# Patient Record
Sex: Male | Born: 1995 | Race: White | Hispanic: No | Marital: Single | Smoking: Never smoker
Health system: Southern US, Community
[De-identification: ages and names within clinical notes are randomized; demographics above are authoritative.]

## PROBLEM LIST (undated history)

## (undated) DIAGNOSIS — S93401A Sprain of unspecified ligament of right ankle, initial encounter: Secondary | ICD-10-CM

## (undated) DIAGNOSIS — L709 Acne, unspecified: Secondary | ICD-10-CM

## (undated) HISTORY — DX: Sprain of unspecified ligament of right ankle, initial encounter: S93.401A

## (undated) HISTORY — DX: Acne, unspecified: L70.9

---
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
## 2015-12-20 ENCOUNTER — Ambulatory Visit (INDEPENDENT_AMBULATORY_CARE_PROVIDER_SITE_OTHER): Payer: BLUE CROSS/BLUE SHIELD | Admitting: Physician Assistant

## 2015-12-20 ENCOUNTER — Ambulatory Visit (INDEPENDENT_AMBULATORY_CARE_PROVIDER_SITE_OTHER): Payer: BLUE CROSS/BLUE SHIELD

## 2015-12-20 VITALS — BP 130/82 | HR 76 | Temp 98.1°F | Resp 16 | Ht 69.0 in | Wt 148.0 lb

## 2015-12-20 DIAGNOSIS — L709 Acne, unspecified: Secondary | ICD-10-CM | POA: Insufficient documentation

## 2015-12-20 DIAGNOSIS — M25571 Pain in right ankle and joints of right foot: Secondary | ICD-10-CM

## 2015-12-20 DIAGNOSIS — S99911A Unspecified injury of right ankle, initial encounter: Secondary | ICD-10-CM | POA: Diagnosis not present

## 2015-12-20 DIAGNOSIS — M7989 Other specified soft tissue disorders: Secondary | ICD-10-CM | POA: Diagnosis not present

## 2015-12-20 DIAGNOSIS — S93401A Sprain of unspecified ligament of right ankle, initial encounter: Secondary | ICD-10-CM | POA: Diagnosis not present
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
## 2015-12-20 NOTE — Patient Instructions (Addendum)
Ankle Sprain An ankle sprain is an injury to the strong, fibrous tissues (ligaments) that hold the bones of your ankle joint together.  CAUSES An ankle sprain is usually caused by a fall or by twisting your ankle. Ankle sprains most commonly occur when you step on the outer edge of your foot, and your ankle turns inward. People who participate in sports are more prone to these types of injuries.  SYMPTOMS   Pain in your ankle. The pain may be present at rest or only when you are trying to stand or walk.  Swelling.  Bruising. Bruising may develop immediately or within 1 to 2 days after your injury.  Difficulty standing or walking, particularly when turning corners or changing directions. DIAGNOSIS  Your caregiver will ask you details about your injury and perform a physical exam of your ankle to determine if you have an ankle sprain. During the physical exam, your caregiver will press on and apply pressure to specific areas of your foot and ankle. Your caregiver will try to move your ankle in certain ways. An X-ray exam may be done to be sure a bone was not broken or a ligament did not separate from one of the bones in your ankle (avulsion fracture).  TREATMENT  Certain types of braces can help stabilize your ankle. Your caregiver can make a recommendation for this. Your caregiver may recommend the use of medicine for pain. If your sprain is severe, your caregiver may refer you to a surgeon who helps to restore function to parts of your skeletal system (orthopedist) or a physical therapist. HOME CARE INSTRUCTIONS   Apply ice to your injury for 1-2 days or as directed by your caregiver. Applying ice helps to reduce inflammation and pain.  Put ice in a plastic bag.  Place a towel between your skin and the bag.  Leave the ice on for 15-20 minutes at a time, every 2 hours while you are awake.  Only take over-the-counter or prescription medicines for pain, discomfort, or fever as directed by  your caregiver.  Elevate your injured ankle above the level of your heart as much as possible for 2-3 days.  If your caregiver recommends crutches, use them as instructed. Gradually put weight on the affected ankle. Continue to use crutches or a cane until you can walk without feeling pain in your ankle.  If you have a plaster splint, wear the splint as directed by your caregiver. Do not rest it on anything harder than a pillow for the first 24 hours. Do not put weight on it. Do not get it wet. You may take it off to take a shower or bath.  You may have been given an elastic bandage to wear around your ankle to provide support. If the elastic bandage is too tight (you have numbness or tingling in your foot or your foot becomes cold and blue), adjust the bandage to make it comfortable.  If you have an air splint, you may blow more air into it or let air out to make it more comfortable. You may take your splint off at night and before taking a shower or bath. Wiggle your toes in the splint several times per day to decrease swelling. SEEK MEDICAL CARE IF:   You have rapidly increasing bruising or swelling.  Your toes feel extremely cold or you lose feeling in your foot.  Your pain is not relieved with medicine. SEEK IMMEDIATE MEDICAL CARE IF:  Your toes are numb or blue.  You have severe pain that is increasing. MAKE SURE YOU:   Understand these instructions.  Will watch your condition.  Will get help right away if you are not doing well or get worse.   This information is not intended to replace advice given to you by your health care provider. Make sure you discuss any questions you have with your health care provider.   Document Released: 09/04/2005 Document Revised: 09/25/2014 Document Reviewed: 09/16/2011 Elsevier Interactive Patient Education 2016 Elsevier Inc.     IF you received an x-ray today, you will receive an invoice from [facility]. Please contact  [facility] at [phone] with questions or concerns regarding your invoice.   IF you received labwork today, you will receive an invoice from [facility]. Please contact [facility] at [phone] with questions or concerns regarding your invoice.   Our billing staff will not be able to assist you with questions regarding bills from these companies.  You will be contacted with the lab results as soon as they are available. The fastest way to get your results is to activate your My Chart account. Instructions are located on the last page of this paperwork. If you have not heard from us regarding the results in 2 weeks, please contact this office.     

## 2016-04-11 ENCOUNTER — Ambulatory Visit (INDEPENDENT_AMBULATORY_CARE_PROVIDER_SITE_OTHER): Payer: BLUE CROSS/BLUE SHIELD | Admitting: Physician Assistant

## 2016-04-11 VITALS — BP 128/76 | HR 87 | Temp 99.0°F | Resp 16 | Ht 69.0 in | Wt 145.4 lb

## 2016-04-11 DIAGNOSIS — S61011A Laceration without foreign body of right thumb without damage to nail, initial encounter: Secondary | ICD-10-CM | POA: Diagnosis not present

## 2016-04-11 DIAGNOSIS — Z026 Encounter for examination for insurance purposes: Secondary | ICD-10-CM | POA: Diagnosis not present

## 2016-04-11 DIAGNOSIS — Z23 Encounter for immunization: Secondary | ICD-10-CM

## 2016-04-11 NOTE — Progress Notes (Signed)
   04/11/2016 1:57 PM   DOB: 08-26-1996 / MRN: [id number]  SUBJECTIVE:  Bryan Jenkins is a 20 y.o. male presenting for an injury that occurred at work yesterday around 1 pm.  Reports he was preparing a sandwhich for a customer and cut the medial aspect of his left thumb.  He was the wound immediately and used peroxide. He covered the wound and went back to working.  He can not remember the last time he had a TD booster.  He denies the los of function of the thumb and feels well overall.     Review of Systems  Skin: Negative for itching and rash.  Neurological: Negative for dizziness, tingling and focal weakness.    Problem list and medications reviewed and updated by myself where necessary, and exist elsewhere in the encounter.   OBJECTIVE:  BP 128/76   Pulse 87   Temp 99 F (37.2 C) (Oral)   Resp 16   Ht 5\' 9"  (1.753 m)   Wt 145 lb 6.4 oz (66 kg)   SpO2 99%   BMI 21.47 kg/m   Physical Exam  Constitutional: Vital signs are normal. He appears well-developed and well-nourished.     Risk and benefits discussed and verbal consent obtained. Anesthetic allergies reviewed. Patient anesthetized using 1:1 mix of 2% lidocaine with epi and Marcaine. The wound was cleansed thoroughly with soap and water. Sterile prep and drape. Wound closed with 4 throws using 4-0 Ethilon suture material. Hemostasis achieved. Mupirocin applied to the wound and bandage placed. The patient tolerated well. Wound instructions were provided and the patient is to return in 9 days for suture removal.    No results found for this or any previous visit (from the past 72 hour(s)).  No results found.  ASSESSMENT AND PLAN  Bryan Jenkins was seen today for laceration.  Diagnoses and all orders for this visit:  Thumb laceration, right, initial encounter: Repaired.  RTC in 9 days for suture removal.  Advised he keep the wound covered particularly while working, otherwise no restrictions.   Encounter related to worker's  compensation claim: See above.   Need for tetanus booster -     Tdap vaccine greater than or equal to 7yo IM    The patient was advised to call or return to clinic if he does not see an improvement in symptoms, or to seek the care of the closest emergency department if he worsens with the above plan.   Deliah Boston, MHS, PA-C [facility] 04/11/2016 1:57 PM

## 2016-04-11 NOTE — Patient Instructions (Addendum)
WOUND CARE Please return in 9 days to have your stitches/staples removed or sooner if you have concerns. Marland Kitchen Keep area clean and dry for 24 hours. Do not remove bandage, if applied. . After 24 hours, remove bandage and wash wound gently with mild soap and warm water. Reapply a new bandage after cleaning wound, if directed. . Continue daily cleansing with soap and water until stitches/staples are removed. . Do not apply any ointments or creams to the wound while stitches/staples are in place, as this may cause delayed healing. . Notify the office if you experience any of the following signs of infection: Swelling, redness, pus drainage, streaking, fever >101.0 F . Notify the office if you experience excessive bleeding that does not stop after 15-20 minutes of constant, firm pressure.      IF you received an x-ray today, you will receive an invoice from [facility]. Please contact [facility] at [phone] with questions or concerns regarding your invoice.   IF you received labwork today, you will receive an invoice from [facility]. Please contact [facility] at [phone] with questions or concerns regarding your invoice.   Our billing staff will not be able to assist you with questions regarding bills from these companies.  You will be contacted with the lab results as soon as they are available. The fastest way to get your results is to activate your My Chart account. Instructions are located on the last page of this paperwork. If you have not heard from [place] regarding the results in 2 weeks, please contact this office.

## 2016-04-20 ENCOUNTER — Encounter: Payer: Self-pay | Admitting: Physician Assistant

## 2016-04-20 ENCOUNTER — Ambulatory Visit (INDEPENDENT_AMBULATORY_CARE_PROVIDER_SITE_OTHER): Payer: BLUE CROSS/BLUE SHIELD | Admitting: Physician Assistant

## 2016-04-20 VITALS — BP 114/62 | HR 68 | Temp 99.1°F | Resp 16

## 2016-04-20 DIAGNOSIS — S61011D Laceration without foreign body of right thumb without damage to nail, subsequent encounter: Secondary | ICD-10-CM

## 2016-04-20 NOTE — Progress Notes (Signed)
   Bryan Jenkins  MRN: [id number] DOB: 08/23/1996  Subjective:  Pt presents to clinic for suture removal.  He has has no problems with the wound -  Review of Systems  Constitutional: Negative for chills and fever.  Gastrointestinal: Negative for nausea.  Skin: Positive for wound.    Patient Active Problem List   Diagnosis Date Noted  . Acne 12/20/2015    Current Outpatient Prescriptions on File Prior to Visit  Medication Sig Dispense Refill  . doxycycline (DORYX) 100 MG EC tablet Take 100 mg by mouth 2 (two) times daily.     No current facility-administered medications on file prior to visit.     No Known Allergies  Pt patients social history was reviewed and updated.  Objective:  BP 114/62 (BP Location: Right Arm, Patient Position: Sitting, Cuff Size: Normal)   Pulse 68   Temp 99.1 F (37.3 C)   Resp 16   Physical Exam  Constitutional: He is oriented to person, place, and time and well-developed, well-nourished, and in no distress.  HENT:  Head: Normocephalic and atraumatic.  Right Ear: External ear normal.  Left Ear: External ear normal.  Eyes: Conjunctivae are normal.  Neck: Normal range of motion.  Pulmonary/Chest: Effort normal.  Neurological: He is alert and oriented to person, place, and time. Gait normal.  Skin: Skin is warm and dry.  Psychiatric: Mood, memory, affect and judgment normal.    Assessment and Plan :  Thumb laceration, right, subsequent encounter  Well healed laceration - sutures removed without problems  Benny Lennert PA-C  [facility] 04/20/2016 1:38 PM

## 2016-04-20 NOTE — Patient Instructions (Signed)
     IF you received an x-ray today, you will receive an invoice from [facility]. Please contact [facility] at [phone] with questions or concerns regarding your invoice.   IF you received labwork today, you will receive an invoice from [facility]. Please contact [facility] at [phone] with questions or concerns regarding your invoice.   Our billing staff will not be able to assist you with questions regarding bills from these companies.  You will be contacted with the lab results as soon as they are available. The fastest way to get your results is to activate your My Chart account. Instructions are located on the last page of this paperwork. If you have not heard from us regarding the results in 2 weeks, please contact this office.      

## 2016-08-16 IMAGING — CR DG ANKLE COMPLETE 3+V*R*
4 series · 4 of 4 positions shown · non-contrast
Comparison: None.

CLINICAL DATA: Right ankle injury

EXAM:
RIGHT ANKLE - COMPLETE 3+ VIEW

[AP]
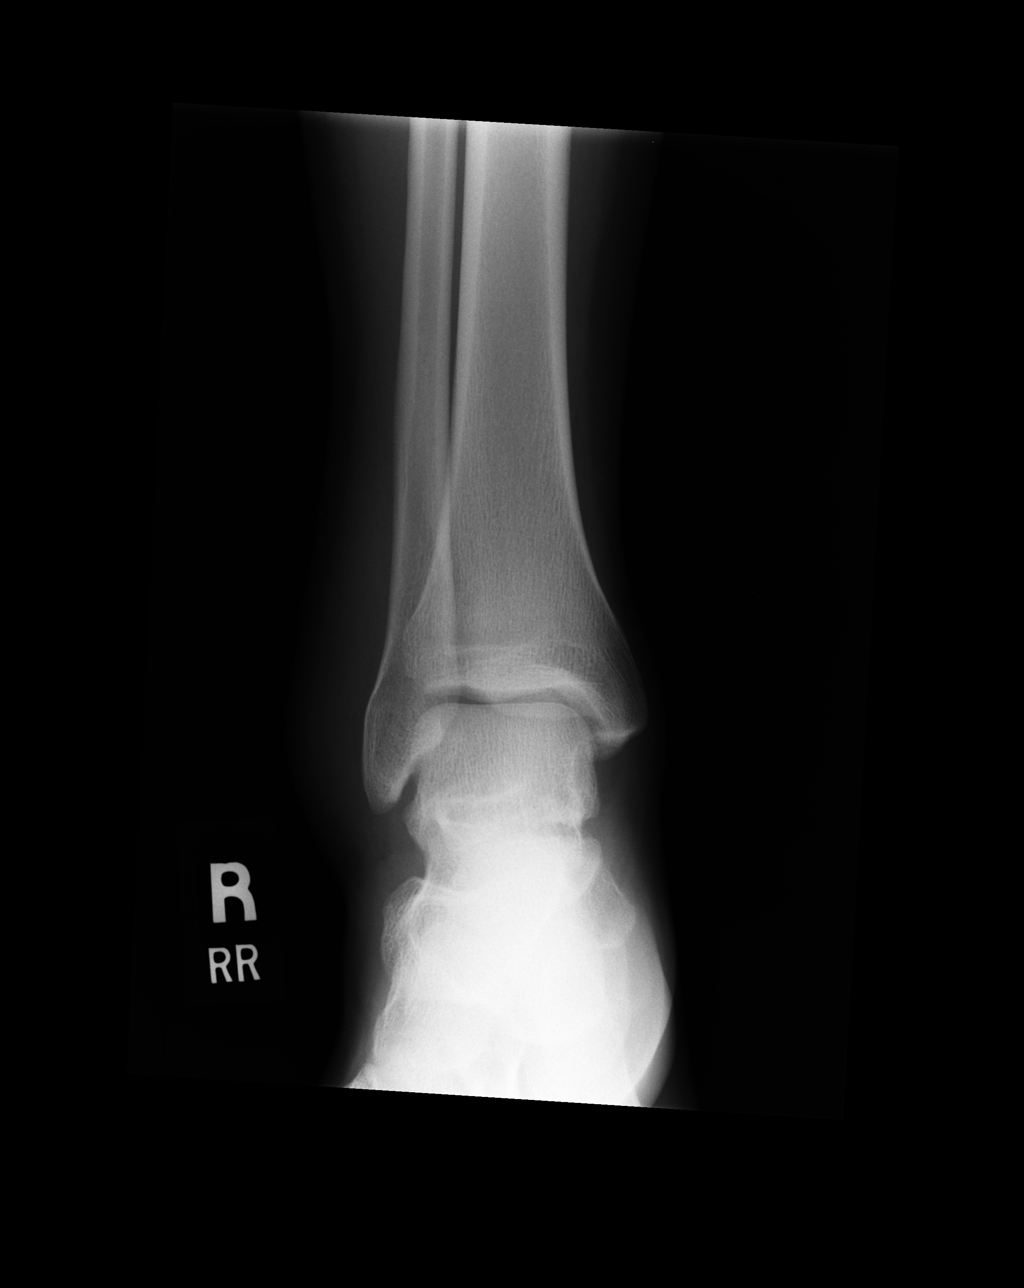

[ap obl int rot (1 of 2)]
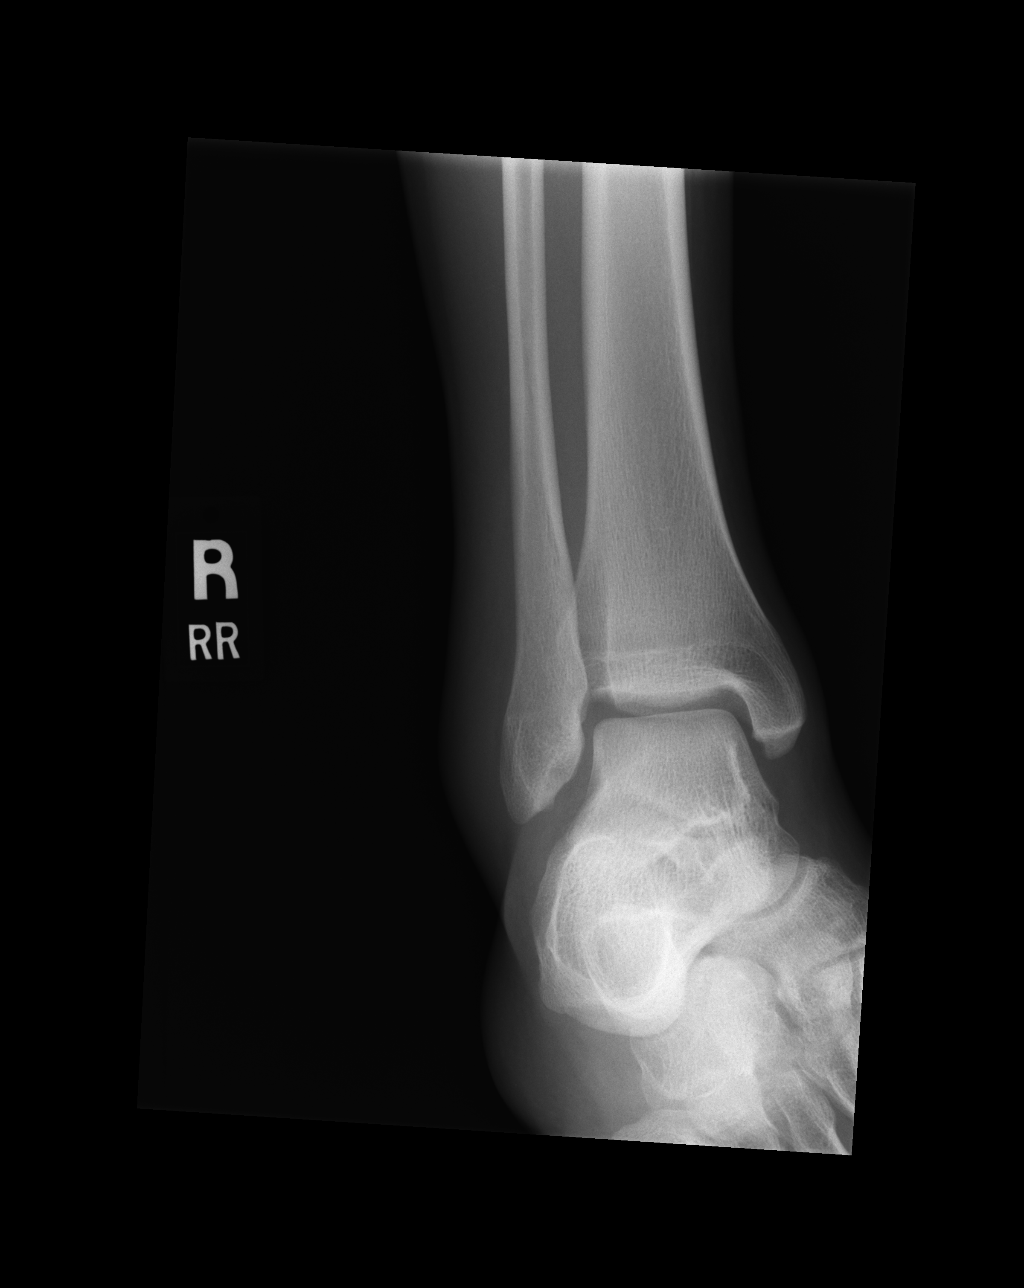

[ap obl int rot (2 of 2)]
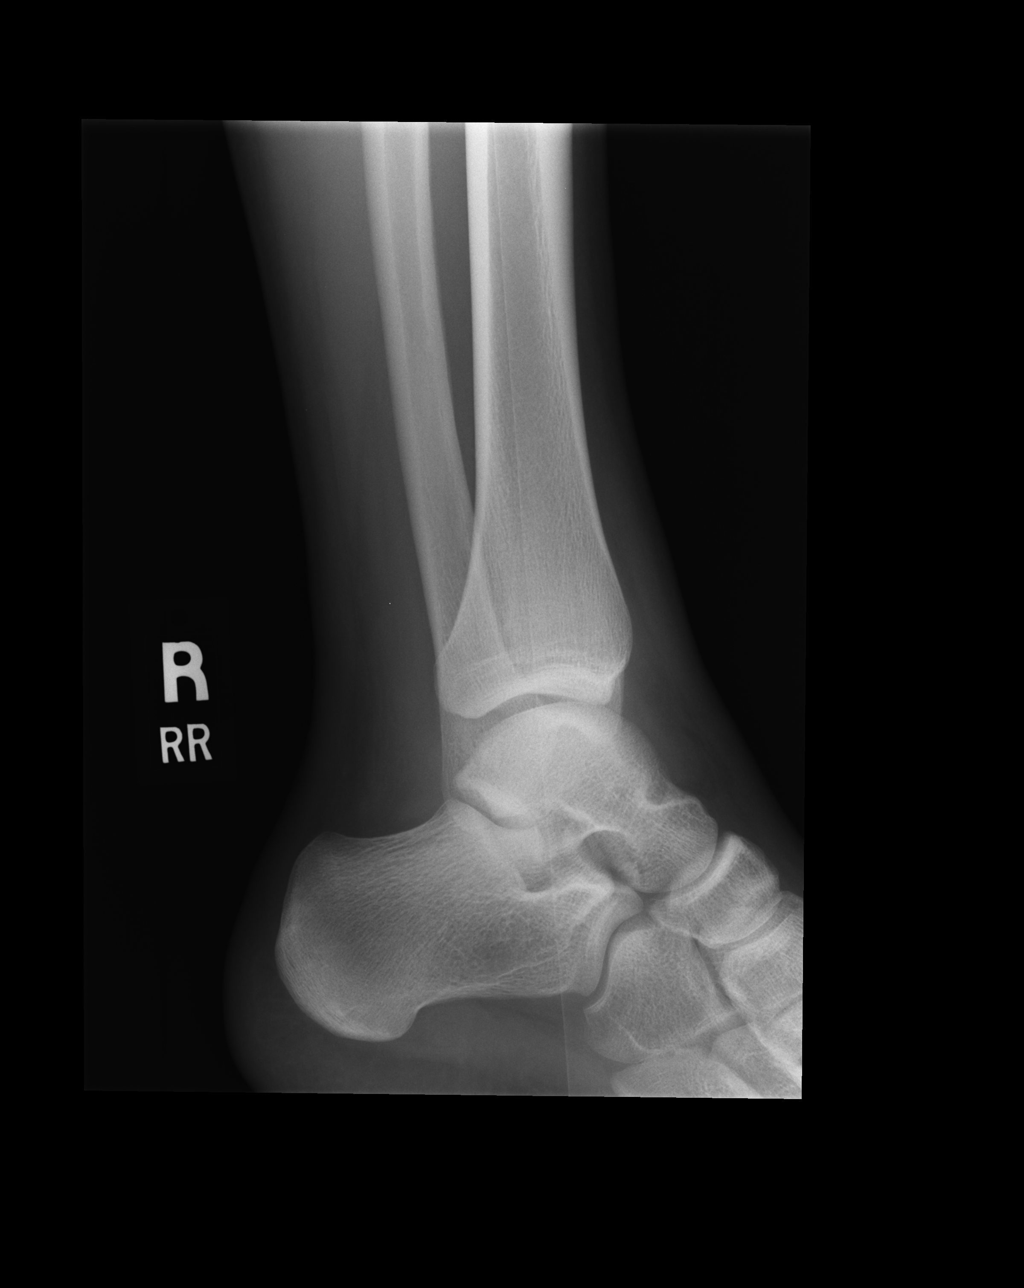

[lateral]
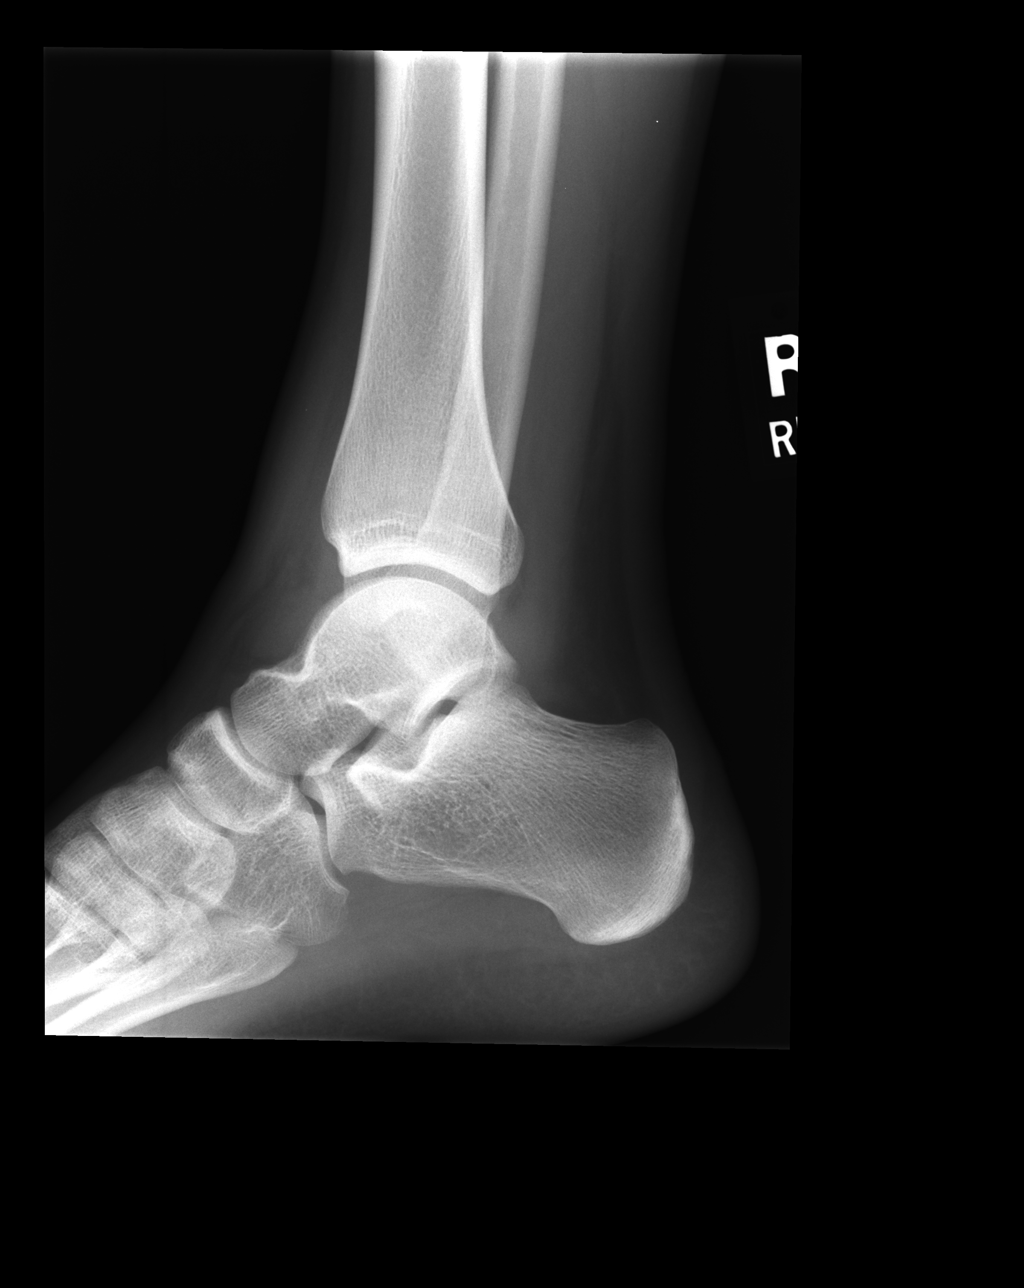

[4 of 4 positions shown; findings below may reference images not displayed]

FINDINGS: Four views of the right ankle submitted. No acute fracture or
subluxation. Significant soft tissue swelling adjacent to lateral
malleolus. Ankle mortise is preserved.
IMPRESSION: No acute fracture or subluxation. Significant soft tissue swelling
adjacent to lateral malleolus.

## 2016-11-06 ENCOUNTER — Ambulatory Visit (INDEPENDENT_AMBULATORY_CARE_PROVIDER_SITE_OTHER): Payer: BLUE CROSS/BLUE SHIELD | Admitting: Urgent Care

## 2016-11-06 VITALS — BP 122/72 | HR 61 | Temp 98.8°F | Resp 17 | Ht 69.5 in | Wt 154.0 lb

## 2016-11-06 DIAGNOSIS — H9191 Unspecified hearing loss, right ear: Secondary | ICD-10-CM | POA: Diagnosis not present

## 2016-11-06 DIAGNOSIS — H6123 Impacted cerumen, bilateral: Secondary | ICD-10-CM

## 2016-11-06 NOTE — Patient Instructions (Addendum)
Earwax Buildup Your ears make a substance called earwax. It may also be called cerumen. Sometimes, too much earwax builds up in your ear canal. This can cause ear pain and make it harder for you to hear. CAUSES This condition is caused by too much earwax production or buildup. RISK FACTORS The following factors may make you more likely to develop this condition:  Cleaning your ears often with swabs.  Having narrow ear canals.  Having earwax that is overly thick or sticky.  Having eczema.  Being dehydrated. SYMPTOMS Symptoms of this condition include:  Reduced hearing.  Ear drainage.  Ear pain.  Ear itch.  A feeling of fullness in the ear or feeling that the ear is plugged.  Ringing in the ear.  Coughing. DIAGNOSIS Your health care provider can diagnose this condition based on your symptoms and medical history. Your health care provider will also do an ear exam to look inside your ear with a scope (otoscope). You may also have a hearing test. TREATMENT Treatment for this condition includes:  Over-the-counter or prescription ear drops to soften the earwax.  Earwax removal by a health care provider. This may be done:  By flushing the ear with body-temperature water.  With a medical instrument that has a loop at the end (earwax curette).  With a suction device. HOME CARE INSTRUCTIONS  Take over-the-counter and prescription medicines only as told by your health care provider.  Do not put any objects, including an ear swab, into your ear. You can clean the opening of your ear canal with a washcloth.  Drink enough water to keep your urine clear or pale yellow.  If you have frequent earwax buildup or you use hearing aids, consider seeing your health care provider every 6-12 months for routine preventive ear cleanings. Keep all follow-up visits as told by your health care provider. SEEK MEDICAL CARE IF:  You have ear pain.  Your condition does not improve with  treatment.  You have hearing loss.  You have blood, pus, or other fluid coming from your ear. This information is not intended to replace advice given to you by your health care provider. Make sure you discuss any questions you have with your health care provider. Document Released: 10/12/2004 Document Revised: 12/27/2015 Document Reviewed: 04/21/2015 Elsevier Interactive Patient Education  2017 Elsevier Inc.      IF you received an x-ray today, you will receive an invoice from [facility]. Please contact [facility] at [phone] with questions or concerns regarding your invoice.   IF you received labwork today, you will receive an invoice from [facility]. Please contact [facility] at [phone] with questions or concerns regarding your invoice.   Our billing staff will not be able to assist you with questions regarding bills from these companies.  You will be contacted with the lab results as soon as they are available. The fastest way to get your results is to activate your My Chart account. Instructions are located on the last page of this paperwork. If you have not heard from us regarding the results in 2 weeks, please contact this office.      

## 2016-11-06 NOTE — Progress Notes (Signed)
    MRN: [id number] DOB: 1996/05/18  Subjective:   Bryan Jenkins is a 21 y.o. male presenting for chief complaint of can't hear well out right ear (not sure why )  Reports 3 day history of decreased hearing in right ear. He tried to use a Q-tip to remove the ear wax but made it worse. He then bought an otc ear wax solution but was painful. Denies fever, ear pain, ear drainage, tinnitus, sinus pain, sore throat, cough.  Bryan Jenkins currently has no medications in their medication list. Also has No Known Allergies.  Bryan Jenkins  has a past medical history of Acne and Right ankle sprain. Also  has no past surgical history on file.  Objective:   Vitals: BP 122/72 (BP Location: Right Arm, Patient Position: Sitting, Cuff Size: Normal)   Pulse 61   Temp 98.8 F (37.1 C) (Oral)   Resp 17   Ht 5' 9.5" (1.765 m)   Wt 154 lb (69.9 kg)   SpO2 97%   BMI 22.42 kg/m   Physical Exam  Constitutional: He is oriented to person, place, and time. He appears well-developed and well-nourished.  HENT:  Right TM is cerumen impacted > Left TM. No tragus tenderness. Nasal turbinates pink and moist, nasal passages patent. No sinus tenderness. Oropharynx clear, mucous membranes moist, dentition in good repair.  Cardiovascular: Normal rate.   Pulmonary/Chest: Effort normal.  Neurological: He is alert and oriented to person, place, and time.   Assessment and Plan :   1. Bilateral impacted cerumen 2. Decreased hearing, right - Ear lavage performed in clinic.  Bryan BambergMario Rayyan Burley, PA-C [facility] [phone] 11/06/2016  5:26 PM

## 2017-01-04 DIAGNOSIS — L700 Acne vulgaris: Secondary | ICD-10-CM | POA: Diagnosis not present

## 2017-12-20 ENCOUNTER — Encounter: Payer: Self-pay | Admitting: Physician Assistant

## 2018-10-24 ENCOUNTER — Encounter (HOSPITAL_COMMUNITY): Payer: Self-pay

## 2018-10-24 ENCOUNTER — Ambulatory Visit (HOSPITAL_COMMUNITY)
Admission: EM | Admit: 2018-10-24 | Discharge: 2018-10-24 | Disposition: A | Discharge status: Home / Self Care | Payer: BLUE CROSS/BLUE SHIELD | Attending: Family Medicine | Admitting: Family Medicine

## 2018-10-24 ENCOUNTER — Other Ambulatory Visit: Payer: Self-pay

## 2018-10-24 DIAGNOSIS — H6122 Impacted cerumen, left ear: Secondary | ICD-10-CM

## 2018-10-24 NOTE — ED Triage Notes (Signed)
Pt cc left ear discomfort. Pt states his ear is clogged up. X 5 days

## 2018-10-30 NOTE — ED Provider Notes (Signed)
  [facility]   [id number] 10/24/18 Arrival Time: 1522  ASSESSMENT & PLAN:  1. Impacted cerumen of left ear    Manual cerumen from L EAC with ear curette performed by me. Improvement in symptoms. No sign of infection.  May f/u with PCP or here as needed.  Reviewed expectations re: course of current medical issues. Questions answered. Outlined signs and symptoms indicating need for more acute intervention. Patient verbalized understanding. After Visit Summary given.   SUBJECTIVE: History from: patient.  Bryan Jenkins is a 23 y.o. male who presents with complaint of left otalgia with slightly decreased hearing; without drainage; without bleeding. Onset gradual, over the past 5 days. Recent cold symptoms: none. Fever: no. Overall normal PO intake without n/v. Sick contacts: no. OTC treatment: none reported.  Social History   Tobacco Use  Smoking Status Never Smoker  Smokeless Tobacco Never Used    ROS: As per HPI.   OBJECTIVE:  Vitals:   10/24/18 1545 10/24/18 1546  BP: 130/83   Pulse: 67   Resp: 18   Temp: 97.9 F (36.6 C)   TempSrc: Oral   SpO2: 98%   Weight:  65.8 kg     General appearance: alert; appears fatigued Ear Canal: cerumen on the left TM: bilateral: normal Neck: supple without LAD Lungs: unlabored respirations, symmetrical air entry; no respiratory distress Skin: warm and dry Psychological: alert and cooperative; normal mood and affect  No Known Allergies  Past Medical History:  Diagnosis Date  . Acne   . Right ankle sprain     Social History   Socioeconomic History  . Marital status: Single    Spouse name: n/a  . Number of children: 0  . Years of education: 12th grade  . Highest education level: Not on file  Occupational History  . Occupation: Museum/gallery exhibitions officer    Comment: part time  Social Needs  . Financial resource strain: Not on file  . Food insecurity:    Worry: Not on file    Inability: Not on file  .  Transportation needs:    Medical: Not on file    Non-medical: Not on file  Tobacco Use  . Smoking status: Never Smoker  . Smokeless tobacco: Never Used  Substance and Sexual Activity  . Alcohol use: No    Alcohol/week: 0.0 standard drinks  . Drug use: No  . Sexual activity: Not on file  Lifestyle  . Physical activity:    Days per week: Not on file    Minutes per session: Not on file  . Stress: Not on file  Relationships  . Social connections:    Talks on phone: Not on file    Gets together: Not on file    Attends religious service: Not on file    Active member of club or organization: Not on file    Attends meetings of clubs or organizations: Not on file    Relationship status: Not on file  . Intimate partner violence:    Fear of current or ex partner: Not on file    Emotionally abused: Not on file    Physically abused: Not on file    Forced sexual activity: Not on file  Other Topics Concern  . Not on file  Social History Narrative   Lives with his parents alternately (they are divorced).            Mardella Layman, MD 10/30/18 1108

## 2019-06-21 ENCOUNTER — Encounter (HOSPITAL_COMMUNITY): Payer: Self-pay | Admitting: *Deleted

## 2019-06-21 ENCOUNTER — Ambulatory Visit (HOSPITAL_COMMUNITY)
Admission: EM | Admit: 2019-06-21 | Discharge: 2019-06-21 | Disposition: A | Discharge status: Home / Self Care | Payer: BC Managed Care – PPO | Attending: Family Medicine | Admitting: Family Medicine

## 2019-06-21 ENCOUNTER — Other Ambulatory Visit: Payer: Self-pay

## 2019-06-21 DIAGNOSIS — R519 Headache, unspecified: Secondary | ICD-10-CM | POA: Insufficient documentation

## 2019-06-21 DIAGNOSIS — Z20828 Contact with and (suspected) exposure to other viral communicable diseases: Secondary | ICD-10-CM | POA: Diagnosis not present

## 2019-06-21 DIAGNOSIS — J029 Acute pharyngitis, unspecified: Secondary | ICD-10-CM | POA: Insufficient documentation

## 2019-06-21 LAB — POCT RAPID STREP A: Streptococcus, Group A Screen (Direct): NEGATIVE

## 2019-06-21 NOTE — Discharge Instructions (Signed)
OK to take Tylenol 1000 mg (2 extra strength tabs) or 975 mg (3 regular strength tabs) every 6 hours as needed.  Ibuprofen 400-600 mg (2-3 over the counter strength tabs) every 6 hours as needed for pain.  Continue supportive care.  OK to use Debrox (peroxide) in the ear to loosen up wax. Also recommend using a bulb syringe (for removing boogers from baby's noses) to flush through warm water. Do not use Q-tips as this can impact wax further.

## 2019-06-21 NOTE — ED Provider Notes (Signed)
  [place]    CSN: [id number] Arrival date & time: 06/21/19  1007  Chief Complaint  Patient presents with  . Sore Throat  . Headache    Bryan Jenkins here for URI complaints.  Duration: 1 day  Associated symptoms: sore throat Denies: sinus congestion, sinus pain, rhinorrhea, itchy watery eyes, ear pain, ear drainage, wheezing, shortness of breath, myalgia, GI s/s's, and fevers Treatment to date: Elderberry, salt water gargles, throat spray Sick contacts: No  ROS:  Const: Denies fevers HEENT: As noted in HPI Lungs: No SOB  Past Medical History:  Diagnosis Date  . Acne   . Right ankle sprain     BP 113/68   Pulse 61   Temp 98 F (36.7 C) (Other (Comment))   Resp 16   SpO2 100%  General: Awake, alert, appears stated age HEENT: AT, [place], ears patent b/l and TM's neg, nares patent w/o discharge, pharynx mildly erythematous and without exudates, MMM Neck: No masses or asymmetry Heart: RRR Lungs: CTAB, no accessory muscle use Psych: Age appropriate judgment and insight, normal mood and affect  Sore throat  Ibuprofen, cont supp care. Rapid strep neg.  Continue to push fluids, practice good hand hygiene. F/u prn. If starting to experience fevers, shaking, or shortness of breath, seek immediate care. Pt voiced understanding and agreement to the plan.    Shelda Pal, DO 06/21/19 1052

## 2019-06-21 NOTE — ED Triage Notes (Signed)
C/O sore throat and HA since yesterday without fever, chills.

## 2019-06-22 LAB — NOVEL CORONAVIRUS, NAA (HOSP ORDER, SEND-OUT TO REF LAB; TAT 18-24 HRS): SARS-CoV-2, NAA: NOT DETECTED

## 2020-01-15 ENCOUNTER — Encounter (HOSPITAL_COMMUNITY): Payer: Self-pay

## 2020-01-15 ENCOUNTER — Ambulatory Visit (HOSPITAL_COMMUNITY)
Admission: EM | Admit: 2020-01-15 | Discharge: 2020-01-15 | Disposition: A | Discharge status: Home / Self Care | Payer: BC Managed Care – PPO | Attending: Family Medicine | Admitting: Family Medicine

## 2020-01-15 DIAGNOSIS — L255 Unspecified contact dermatitis due to plants, except food: Secondary | ICD-10-CM

## 2020-01-15 MED ORDER — DEXAMETHASONE SODIUM PHOSPHATE 10 MG/ML IJ SOLN
INTRAMUSCULAR | Status: AC
  Filled 2020-01-15: qty 1

## 2020-01-15 MED ORDER — DEXAMETHASONE SODIUM PHOSPHATE 10 MG/ML IJ SOLN
10.0000 mg | Freq: Once | INTRAMUSCULAR | Status: AC
  Administered 2020-01-15: 10:00:00 10 mg via INTRAMUSCULAR

## 2020-01-15 NOTE — ED Triage Notes (Signed)
Pt reports having a rash in his legs, after he was sin contact with poison ivy 9 days ago. Pt reports he is taking Benadryl without relief.

## 2020-01-15 NOTE — ED Provider Notes (Signed)
[place]   [id number] 01/15/20 Arrival Time: 3016  ASSESSMENT & PLAN:  1. Rhus dermatitis     Meds ordered this encounter  Medications  . dexamethasone (DECADRON) injection 10 mg    Bendryl if needed. No signs of skin infection.  Will follow up with PCP or here if worsening or failing to improve as anticipated. Reviewed expectations re: course of current medical issues. Questions answered. Outlined signs and symptoms indicating need for more acute intervention. Patient verbalized understanding. After Visit Summary given.   SUBJECTIVE:  Bryan Jenkins is a 24 y.o. male who presents with a skin complaint.   Location: bilateral LE Onset: abrupt Duration: 1 week Associated pruritis? significant Associated pain? none Progression: stable  Drainage? none Known trigger? questions rhus New soaps/lotions/topicals/detergents? no Contacts with similar? No  Recent travel? No  Other associated symptoms: none Therapies tried thus far: Benadryl with little relief Arthralgia or myalgia? none Recent illness? none Fever? none New medications? none No specific aggravating or alleviating factors reported.   OBJECTIVE: Vitals:   01/15/20 0939  BP: 120/72  Pulse: 61  Resp: 16  Temp: 98.4 F (36.9 C)  TempSrc: Oral  SpO2: 98%    General appearance: alert; no distress HEENT: [place]; AT Neck: supple with FROM Lungs: clear to auscultation bilaterally Heart: regular rate and rhythm Extremities: no edema; moves all extremities normally Skin: warm and dry; signs of infection: no; {ANATOMY; areas of linear papules and vesicles with surrounding erythema over bilateral LE Psychological: alert and cooperative; normal mood and affect  No Known Allergies  Past Medical History:  Diagnosis Date  . Acne   . Right ankle sprain    Social History   Socioeconomic History  . Marital status: Single    Spouse name: n/a  . Number of children: 0  . Years of education: 12th  grade  . Highest education level: Not on file  Occupational History  . Occupation: [employer]    Comment: part time  Tobacco Use  . Smoking status: Never Smoker  . Smokeless tobacco: Never Used  Substance and Sexual Activity  . Alcohol use: Yes    Comment: occasionally  . Drug use: Not Currently    Types: Cocaine    Comment: last had 2019  . Sexual activity: Not on file  Other Topics Concern  . Not on file  Social History Narrative   Lives with his parents alternately (they are divorced).   Social Determinants of Health   Financial Resource Strain:   . Difficulty of Paying Living Expenses:   Food Insecurity:   . Worried About Charity fundraiser in the Last Year:   . Arboriculturist in the Last Year:   Transportation Needs:   . Film/video editor (Medical):   Marland Kitchen Lack of Transportation (Non-Medical):   Physical Activity:   . Days of Exercise per Week:   . Minutes of Exercise per Session:   Stress:   . Feeling of Stress :   Social Connections:   . Frequency of Communication with Friends and Family:   . Frequency of Social Gatherings with Friends and Family:   . Attends Religious Services:   . Active Member of Clubs or Organizations:   . Attends Archivist Meetings:   Marland Kitchen Marital Status:   Intimate Partner Violence:   . Fear of Current or Ex-Partner:   . Emotionally Abused:   Marland Kitchen Physically Abused:   . Sexually Abused:    Family  History  Problem Relation Age of Onset  . Healthy Mother   . Healthy Father    History reviewed. No pertinent surgical history.   Mardella Layman, MD 01/15/20 1045

## 2020-09-16 ENCOUNTER — Other Ambulatory Visit: Payer: BC Managed Care – PPO

## 2020-09-16 DIAGNOSIS — Z20822 Contact with and (suspected) exposure to covid-19: Secondary | ICD-10-CM

## 2020-09-19 LAB — SARS-COV-2, NAA 2 DAY TAT

## 2020-09-19 LAB — NOVEL CORONAVIRUS, NAA: SARS-CoV-2, NAA: DETECTED — AB

## 2020-09-22 ENCOUNTER — Other Ambulatory Visit: Payer: BC Managed Care – PPO

## 2024-06-30 ENCOUNTER — Ambulatory Visit (HOSPITAL_COMMUNITY): Admission: EM | Admit: 2024-06-30 | Discharge: 2024-06-30 | Disposition: A | Discharge status: Home / Self Care

## 2024-06-30 ENCOUNTER — Encounter (HOSPITAL_COMMUNITY): Payer: Self-pay | Admitting: *Deleted

## 2024-06-30 DIAGNOSIS — B353 Tinea pedis: Secondary | ICD-10-CM | POA: Diagnosis not present

## 2024-06-30 DIAGNOSIS — L03032 Cellulitis of left toe: Secondary | ICD-10-CM

## 2024-06-30 MED ORDER — MUPIROCIN CALCIUM 2 % EX CREA: 1.0000 | TOPICAL_CREAM | Freq: Two times a day (BID) | CUTANEOUS | 0 refills | Status: AC

## 2024-06-30 MED ORDER — AMOXICILLIN-POT CLAVULANATE 875-125 MG PO TABS: 1.0000 | ORAL_TABLET | Freq: Two times a day (BID) | ORAL | 0 refills | Status: AC

## 2024-06-30 MED ORDER — NYSTATIN 100000 UNIT/GM EX CREA: TOPICAL_CREAM | CUTANEOUS | 0 refills | Status: AC

## 2024-06-30 NOTE — ED Provider Notes (Addendum)
 [facility]    CSN: [id number] Arrival date & time: 06/30/24  9141      History   Chief Complaint Chief Complaint  Patient presents with   Foot Pain    HPI Bryan Jenkins is a 28 y.o. male.  Patient works as a Psychologist, occupational.  Patient says that he noticed some pain and redness between his 4th and 5th toes on his left foot roughly 1 week ago.  Often gets athlete's foot and so he has been applying some Lamisil ointment to his entire foot but it is not improving his symptoms.  Over the last 2 days he has noticed some redness spreading from his pinky toe up into the dorsum of his foot.  Denies any fevers or chills at this time.  Denies any numbness or tingling.  Denies any injury to his foot or high risk exposures.  No MRSA risk factors on HPI.  States this feels different than his usual athlete's foot.   Foot Pain    Past Medical History:  Diagnosis Date   Acne    Right ankle sprain     Patient Active Problem List   Diagnosis Date Noted   Acne 12/20/2015    History reviewed. No pertinent surgical history.     Home Medications    Prior to Admission medications   Medication Sig Start Date End Date Taking? Authorizing Provider  amoxicillin-clavulanate (AUGMENTIN) 875-125 MG tablet Take 1 tablet by mouth every 12 (twelve) hours for 10 days. 06/30/24 07/10/24 Yes Lynwood Barter, DO  mupirocin cream (BACTROBAN) 2 % Apply 1 Application topically 2 (two) times daily. 06/30/24  Yes Lynwood Barter, DO  nystatin cream (MYCOSTATIN) Apply to affected area 2 times daily 06/30/24  Yes Lynwood Barter, DO  diphenhydrAMINE (BENADRYL) 50 MG tablet Take 50 mg by mouth at bedtime as needed for itching.    [provider]    Family History Family History  Problem Relation Age of Onset   Healthy Mother    Healthy Father     Social History Social History   Tobacco Use   Smoking status: Never   Smokeless tobacco: Never  Vaping Use   Vaping status: Some Days  Substance  Use Topics   Alcohol use: Yes    Comment: occasionally   Drug use: Not Currently    Types: Cocaine    Comment: last had 2019     Allergies   Patient has no known allergies.   Review of Systems Review of Systems  ROS negative except as noted in HPI above   Physical Exam Triage Vital Signs ED Triage Vitals  Encounter Vitals Group     BP 06/30/24 0954 (!) 128/90     Girls Systolic BP Percentile --      Girls Diastolic BP Percentile --      Boys Systolic BP Percentile --      Boys Diastolic BP Percentile --      Pulse Rate 06/30/24 0954 65     Resp 06/30/24 0954 16     Temp 06/30/24 0954 97.9 F (36.6 C)     Temp Source 06/30/24 0954 Oral     SpO2 06/30/24 0954 98 %     Weight --      Height --      Head Circumference --      Peak Flow --      Pain Score 06/30/24 0953 6     Pain Loc --      Pain  Education --      Exclude from [employer] Chart --    No data found.  Updated Vital Signs BP (!) 128/90 (BP Location: Left Arm)   Pulse 65   Temp 97.9 F (36.6 C) (Oral)   Resp 16   SpO2 98%   Visual Acuity Right Eye Distance:   Left Eye Distance:   Bilateral Distance:    Right Eye Near:   Left Eye Near:    Bilateral Near:     Physical Exam Vitals and nursing note reviewed.  Constitutional:      General: He is not in acute distress.    Appearance: He is well-developed. He is not ill-appearing, toxic-appearing or diaphoretic.  HENT:     Head: Normocephalic and atraumatic.  Eyes:     Conjunctiva/sclera: Conjunctivae normal.  Cardiovascular:     Rate and Rhythm: Normal rate and regular rhythm.     Heart sounds: No murmur heard. Pulmonary:     Effort: Pulmonary effort is normal. No respiratory distress.     Breath sounds: Normal breath sounds.  Abdominal:     Palpations: Abdomen is soft.     Tenderness: There is no abdominal tenderness.  Musculoskeletal:        General: No swelling.     Cervical back: Neck supple.  Skin:    General: Skin is warm and dry.      Capillary Refill: Capillary refill takes less than 2 seconds.     Findings: Erythema (proximally spreading from 5th toe up onto dorsum of foot) and lesion (in between 4th and 5th toes on Left foot) present.  Neurological:     General: No focal deficit present.     Mental Status: He is alert.  Psychiatric:        Mood and Affect: Mood normal.      UC Treatments / Results  Labs (all labs ordered are listed, but only abnormal results are displayed) Labs Reviewed - No data to display  EKG   Radiology No results found.  Procedures Procedures (including critical care time)  Medications Ordered in UC Medications - No data to display  Initial Impression / Assessment and Plan / UC Course  I have reviewed the triage vital signs and the nursing notes.  Pertinent labs & imaging results that were available during my care of the patient were reviewed by me and considered in my medical decision making (see chart for details).     HPI and exam consistent with left foot cellulitis Final Clinical Impressions(s) / UC Diagnoses   #Cellulitis of left fifth toe spreading proximally to dorsum of foot Please take Augmentin prescription twice daily for the next 10 days to treat left foot infection Please apply mupirocin ointment in between 4th and 5th toes twice daily for next 10 days Please apply nystatin ointment to entire foot to help treat athlete's foot as needed Please return to clinic if redness on foot continues to spread up leg or you develop new onset fevers or chills Patient understands and agrees to treatment plan.  No further questions or concerns at this time.  Final diagnoses:  Cellulitis of small toe of left foot  Athlete's foot on left     Discharge Instructions      Please take Augmentin prescription twice daily for the next 10 days to treat left foot infection Please apply mupirocin ointment in between 4th and 5th toes twice daily for next 10 days Please apply  nystatin ointment to entire foot to  help treat athlete's foot as needed Please return to clinic if redness on foot continues to spread up leg or you develop new onset fevers or chills     ED Prescriptions     Medication Sig Dispense Auth. Provider   amoxicillin-clavulanate (AUGMENTIN) 875-125 MG tablet Take 1 tablet by mouth every 12 (twelve) hours for 10 days. 20 tablet Lynwood Barter, DO   mupirocin cream (BACTROBAN) 2 % Apply 1 Application topically 2 (two) times daily. 30 g Lynwood Barter, DO   nystatin cream (MYCOSTATIN) Apply to affected area 2 times daily 30 g Lynwood Barter, DO      PDMP not reviewed this encounter.   Lynwood Barter, DO 06/30/24 1023    Lynwood Barter, DO 06/30/24 1023

## 2024-06-30 NOTE — Discharge Instructions (Addendum)
 Please take Augmentin prescription twice daily for the next 10 days to treat left foot infection Please apply mupirocin ointment in between 4th and 5th toes twice daily for next 10 days Please apply nystatin ointment to entire foot to help treat athlete's foot as needed Please return to clinic if redness on foot continues to spread up leg or you develop new onset fevers or chills

## 2024-06-30 NOTE — ED Triage Notes (Signed)
 Pt states he has left foot pain X 6 days. He has been putting lotrimin on it without relief. There is some redness and swelling

## 2024-07-03 ENCOUNTER — Ambulatory Visit

## 2024-07-03 DIAGNOSIS — L03116 Cellulitis of left lower limb: Secondary | ICD-10-CM | POA: Diagnosis not present

## 2024-07-03 DIAGNOSIS — L03032 Cellulitis of left toe: Secondary | ICD-10-CM | POA: Diagnosis not present

## 2024-07-03 NOTE — Progress Notes (Signed)
 Subjective:  Patient ID: Bryan Jenkins, male    DOB: [birth year],  MRN: [id number]  Chief Complaint  Patient presents with   Cellulitis    Patient states that he went to urgent care and they gave him some anabiotics for his Cellulitis, patient has some pain when stepping down on his foot    28 y.o. male presents with the above complaint. He relates to a history of athlete's foot that he normally treats successfully with lotrimin. He has to wear steel toed boots at work and experiences sweating of his feet. He states that recently his athlete's foot got progressively worse and developed a small crack in the left 4th interspace. He then developed cellulitis surrounding the area.  He proceeded to go to urgent care where they started him on Augmentin.  He then experienced a worsening of his infection over the next 12 to 24 hours before the area settle down and cellulitis significantly improved.  He states it has continued to improve. He does have remaining Augmentin.  Review of Systems: Negative except as noted in the HPI. Denies N/V/F/Ch.  Past Medical History:  Diagnosis Date   Acne    Right ankle sprain     Current Outpatient Medications:    amoxicillin-clavulanate (AUGMENTIN) 875-125 MG tablet, Take 1 tablet by mouth every 12 (twelve) hours for 10 days., Disp: 20 tablet, Rfl: 0   diphenhydrAMINE (BENADRYL) 50 MG tablet, Take 50 mg by mouth at bedtime as needed for itching., Disp: , Rfl:    mupirocin cream (BACTROBAN) 2 %, Apply 1 Application topically 2 (two) times daily., Disp: 30 g, Rfl: 0   nystatin cream (MYCOSTATIN), Apply to affected area 2 times daily, Disp: 30 g, Rfl: 0  Social History   Tobacco Use  Smoking Status Never  Smokeless Tobacco Never    No Known Allergies Objective:  There were no vitals filed for this visit. There is no height or weight on file to calculate BMI. Constitutional Well developed. Well nourished. Oriented to person, place, and time.  Vascular  Dorsalis pedis pulses palpable bilaterally. Posterior tibial pulses palpable bilaterally. Capillary refill normal to all digits.  No cyanosis or clubbing noted. Pedal hair growth normal.  Neurologic Normal speech. Epicritic sensation to light touch grossly present bilaterally. Negative tinel sign at tarsal tunnel bilaterally.   Dermatologic Skin texture and turgor are within normal limits.  Pinpoint, superficial opening in the fourth interspace that does not probe deep. Mild maceration in interspace. There is minimal serosanguineous drainage.  No signs of purulence.  There is minimal dorsal foot cellulitis extending from this area.  There is an outline of previous amount of cellulitis, it is approximately 80% resolved. No skin lesions.  Musculoskeletal: 5 out of 5 muscle strength all major pedal muscle groups.  No contributing deformity.      Assessment:   1. Cellulitis of fifth toe of left foot   2. Cellulitis of left foot    Plan:  - Patient was evaluated and treated and all questions answered.  Left foot cellulitis - Discussed with the patient the diagnosis of left foot cellulitis.  He did go to urgent care and an emergency department for evaluations.  He was started on Augmentin.  He does have remaining Augmentin.  His cellulitis has improved significantly.  I recommend that he continues taking the Augmentin. - Discussed the small opening in his left fourth interspace.  There is mild surrounding macerated tissue.  I discussed with him cleaning the area after  working and applying a small amount of Betadine to help dry the area out.  He expresses understanding of this and will plan to do this daily. - RTC if cellulitis does not resolve     Prentice Ovens, DPM AACFAS Fellowship Trained Podiatric Surgeon [facility]

## 2024-07-18 ENCOUNTER — Ambulatory Visit (HOSPITAL_COMMUNITY)
Admission: EM | Admit: 2024-07-18 | Discharge: 2024-07-18 | Disposition: A | Discharge status: Home / Self Care | Attending: Emergency Medicine | Admitting: Emergency Medicine

## 2024-07-18 ENCOUNTER — Encounter (HOSPITAL_COMMUNITY): Payer: Self-pay

## 2024-07-18 DIAGNOSIS — Z202 Contact with and (suspected) exposure to infections with a predominantly sexual mode of transmission: Secondary | ICD-10-CM | POA: Insufficient documentation

## 2024-07-18 DIAGNOSIS — Z113 Encounter for screening for infections with a predominantly sexual mode of transmission: Secondary | ICD-10-CM | POA: Diagnosis present

## 2024-07-18 LAB — HIV ANTIBODY (ROUTINE TESTING W REFLEX): HIV Screen 4th Generation wRfx: NONREACTIVE

## 2024-07-18 MED ORDER — CEFTRIAXONE SODIUM 500 MG IJ SOLR
INTRAMUSCULAR | Status: AC
  Filled 2024-07-18: qty 500

## 2024-07-18 MED ORDER — CEFTRIAXONE SODIUM 500 MG IJ SOLR
500.0000 mg | INTRAMUSCULAR | Status: DC
  Administered 2024-07-18: 500 mg via INTRAMUSCULAR

## 2024-07-18 MED ORDER — LIDOCAINE HCL (PF) 1 % IJ SOLN
INTRAMUSCULAR | Status: AC
  Filled 2024-07-18: qty 2

## 2024-07-18 MED ORDER — DOXYCYCLINE HYCLATE 100 MG PO TABS: 100.0000 mg | ORAL_TABLET | Freq: Two times a day (BID) | ORAL | 0 refills | Status: AC

## 2024-07-18 NOTE — ED Triage Notes (Signed)
 Pt states his gf was positive for chlamydia. States has had penile d/c and burning on urination at times.

## 2024-07-18 NOTE — ED Provider Notes (Signed)
 [facility]    CSN: [id number] Arrival date & time: 07/18/24  1339    HISTORY   Chief Complaint  Patient presents with   Exposure to STD   HPI Bryan Jenkins is a pleasant, 28 y.o. male who presents to urgent care today. Patient requests STD screening. Patient endorses penile discharge, burning during urination, and confirmed exposure to chlamydia.   Patient denies testicular pain or swelling, scrotal pain or swelling, perineal pain, rectal pain, pain with defecation, increased frequency of urination, fever , body aches, chills, rigors, malaise, and significant fatigue.   The history is provided by the patient.  Exposure to STD    Past Medical History:  Diagnosis Date   Acne    Right ankle sprain    Patient Active Problem List   Diagnosis Date Noted   Acne 12/20/2015   History reviewed. No pertinent surgical history.  Home Medications    Prior to Admission medications   Medication Sig Start Date End Date Taking? Authorizing Provider  diphenhydrAMINE (BENADRYL) 50 MG tablet Take 50 mg by mouth at bedtime as needed for itching.    [provider]  mupirocin cream (BACTROBAN) 2 % Apply 1 Application topically 2 (two) times daily. 06/30/24   Lynwood Barter, DO  nystatin cream (MYCOSTATIN) Apply to affected area 2 times daily 06/30/24   Lynwood Barter, DO    Family History Family History  Problem Relation Age of Onset   Healthy Mother    Healthy Father    Social History Social History   Tobacco Use   Smoking status: Never   Smokeless tobacco: Never  Vaping Use   Vaping status: Some Days  Substance Use Topics   Alcohol use: Yes    Comment: occasionally   Drug use: Not Currently    Types: Cocaine    Comment: last had 2019   Allergies   Patient has no known allergies.  Review of Systems Review of Systems Pertinent findings revealed after performing a 14 point review of systems has been noted in the history of present  illness.  Physical Exam Vital Signs BP 113/72 (BP Location: Left Arm)   Pulse 79   Temp 98.1 F (36.7 C) (Oral)   Resp 18   SpO2 97%   No data found.  Physical Exam Vitals and nursing note reviewed.  Constitutional:      General: He is awake. He is not in acute distress.    Appearance: Normal appearance. He is not ill-appearing.  HENT:     Head: Normocephalic and atraumatic.  Eyes:     General: Lids are normal.        Right eye: No discharge.        Left eye: No discharge.     Conjunctiva/sclera: Conjunctivae normal.     Right eye: Right conjunctiva is not injected.     Left eye: Left conjunctiva is not injected.  Neck:     Trachea: Trachea and phonation normal.  Cardiovascular:     Rate and Rhythm: Normal rate and regular rhythm.  Pulmonary:     Effort: Pulmonary effort is normal.  Genitourinary:    Comments: Pt politely declines GU exam, pt did provide a penile swab for testing.   Musculoskeletal:     Cervical back: Full passive range of motion without pain, normal range of motion and neck supple.  Lymphadenopathy:     Cervical: No cervical adenopathy.  Skin:    General: Skin is warm and dry.  Findings: No erythema or rash.  Neurological:     General: No focal deficit present.     Mental Status: He is alert and oriented to person, place, and time. Mental status is at baseline.  Psychiatric:        Attention and Perception: Attention and perception normal.        Mood and Affect: Mood normal.        Speech: Speech normal.        Behavior: Behavior normal. Behavior is cooperative.        Thought Content: Thought content normal.     Visual Acuity Right Eye Distance:   Left Eye Distance:   Bilateral Distance:    Right Eye Near:   Left Eye Near:    Bilateral Near:     UC Couse / Diagnostics / Procedures:     Radiology No results found.  Procedures Procedures (including critical care time) EKG  Pending results:  Labs Reviewed  RPR  HIV  ANTIBODY (ROUTINE TESTING W REFLEX)  CYTOLOGY, (ORAL, ANAL, URETHRAL) ANCILLARY ONLY    Medications Ordered in UC: Medications  cefTRIAXone (ROCEPHIN) injection 500 mg (has no administration in time range)    UC Diagnoses / Final Clinical Impressions(s)   I have reviewed the triage vital signs and the nursing notes.  Pertinent labs & imaging results that were available during my care of the patient were reviewed by me and considered in my medical decision making (see chart for details).    Final diagnoses:  Screening examination for STD (sexually transmitted disease)  Exposure to chlamydia   Patient was provided with Ceftriaxone 500 mg IM for empiric treatment of presumed GC based on the history provided to me today. Patient was provided with Doxycycline 100 mg twice daily for 7 days for empiric treatment of presumed chlamydia based on the history provided to me today. Patient was advised to abstain from sexual intercourse for the 7 days after being treated.  Patient was also advised to use condoms to protect themselves from STD exposure. STD screening was performed, patient advised that the results be posted to their MyChart and if any of the results are positive, they will be notified by phone, further treatment will be provided as indicated based on results of STD screening. Return precautions advised.  Drug allergies reviewed, all questions addressed.   Please see discharge instructions below for details of plan of care as provided to patient. ED Prescriptions     Medication Sig Dispense Auth. Provider   doxycycline (VIBRA-TABS) 100 MG tablet Take 1 tablet (100 mg total) by mouth 2 (two) times daily for 7 days. 14 tablet Joesph Shaver Scales, PA-C      PDMP not reviewed this encounter.  Disposition Upon Discharge:  Condition: stable for discharge home  Patient presented with concern for an acute illness with associated systemic symptoms and significant discomfort requiring  urgent management. In my opinion, this is a condition that a prudent lay person (someone who possesses an average knowledge of health and medicine) may potentially expect to result in complications if not addressed urgently such as respiratory distress, impairment of bodily function or dysfunction of bodily organs.   As such, the patient has been evaluated and assessed, work-up was performed and treatment was provided in alignment with urgent care protocols and evidence based medicine.  Patient/parent/caregiver has been advised that the patient may require follow up for further testing and/or treatment if the symptoms continue in spite of treatment, as clinically indicated  and appropriate.  Routine symptom specific, illness specific and/or disease specific instructions were discussed with the patient and/or caregiver at length.  Prevention strategies for avoiding STD exposure were also discussed.  The patient will follow up with their current PCP if and as advised. If the patient does not currently have a PCP we will assist them in obtaining one.   The patient may need specialty follow up if the symptoms continue, in spite of conservative treatment and management, for further workup, evaluation, consultation and treatment as clinically indicated and appropriate.  Patient/parent/caregiver verbalized understanding and agreement of plan as discussed.  All questions were addressed during visit.  Please see discharge instructions below for further details of plan.    Discharge Instructions      Based on the symptoms and concerns you shared with me today, you were treated for presumed gonorrhea with an injection of ceftriaxone 500 mg.  This is the only treatment you will need for gonorrhea.    Based on the symptoms and concerns you shared with me today, you were treated for presumed chlamydia with a prescription for doxycycline, 1 tablet twice daily for the next 7 days.    Please take all tablets as  prescribed, do not skip doses.  Failure to take all doses as prescribed can result in a worsening infection that will be more difficult to treat and resolve.    Please abstain from sexual intercourse of any kind, vaginal, oral or anal, for 7 days after completing treatment.      The results of your STD testing today which tests for gonorrhea, chlamydia and trichomonas will be posted to your MyChart account in the next 3 to 5 days.  If any of your results are abnormal, you will receive a phone call regarding further treatment.  Additional prescriptions, if any are needed, will be provided for you at your pharmacy.   Please abstain from sexual intercourse of any kind, vaginal, oral or anal, until until you have received the results of your STD testing.     Please remember that there are only 2 ways to prevent transmission of sexually transmitted disease: to not have sex or to always use condoms when having sex.       If you have not had complete resolution of your symptoms after completing any recommended treatment or if your symptoms worsen, please return for repeat evaluation.     Thank you for visiting [facility] today.  We appreciate the opportunity to participate in your care.       This office note has been dictated using Teaching laboratory technician.  Unfortunately, this method of dictation can sometimes lead to typographical or grammatical errors.  I apologize for your inconvenience in advance if this occurs.  Please do not hesitate to reach out to me if clarification is needed.       Joesph Shaver Scales, PA-C 07/18/24 [phone]

## 2024-07-18 NOTE — Discharge Instructions (Addendum)
 Based on the symptoms and concerns you shared with me today, you were treated for presumed gonorrhea with an injection of ceftriaxone 500 mg.  This is the only treatment you will need for gonorrhea.    Based on the symptoms and concerns you shared with me today, you were treated for presumed chlamydia with a prescription for doxycycline, 1 tablet twice daily for the next 7 days.    Please take all tablets as prescribed, do not skip doses.  Failure to take all doses as prescribed can result in a worsening infection that will be more difficult to treat and resolve.    Please abstain from sexual intercourse of any kind, vaginal, oral or anal, for 7 days after completing treatment.      The results of your STD testing today which tests for gonorrhea, chlamydia and trichomonas will be posted to your MyChart account in the next 3 to 5 days.  If any of your results are abnormal, you will receive a phone call regarding further treatment.  Additional prescriptions, if any are needed, will be provided for you at your pharmacy.   Please abstain from sexual intercourse of any kind, vaginal, oral or anal, until until you have received the results of your STD testing.     Please remember that there are only 2 ways to prevent transmission of sexually transmitted disease: to not have sex or to always use condoms when having sex.       If you have not had complete resolution of your symptoms after completing any recommended treatment or if your symptoms worsen, please return for repeat evaluation.     Thank you for visiting [facility] today.  We appreciate the opportunity to participate in your care.

## 2024-07-19 LAB — RPR: RPR Ser Ql: NONREACTIVE

## 2024-07-20 ENCOUNTER — Ambulatory Visit (HOSPITAL_COMMUNITY): Payer: Self-pay | Admitting: Emergency Medicine

## 2024-07-21 LAB — CYTOLOGY, (ORAL, ANAL, URETHRAL) ANCILLARY ONLY
Chlamydia: POSITIVE — AB
Comment: NEGATIVE
Comment: NEGATIVE
Comment: NORMAL
Neisseria Gonorrhea: NEGATIVE
Trichomonas: NEGATIVE
